# Patient Record
Sex: Male | Born: 1960 | Race: Black or African American | Hispanic: No | Marital: Married | Smoking: Current some day smoker
Health system: Southern US, Community
[De-identification: ages and names within clinical notes are randomized; demographics above are authoritative.]

## PROBLEM LIST (undated history)

## (undated) DIAGNOSIS — K219 Gastro-esophageal reflux disease without esophagitis: Secondary | ICD-10-CM

## (undated) DIAGNOSIS — I10 Essential (primary) hypertension: Secondary | ICD-10-CM

## (undated) DIAGNOSIS — M109 Gout, unspecified: Secondary | ICD-10-CM

## (undated) HISTORY — DX: Gout, unspecified: M10.9

## (undated) HISTORY — PX: ABDOMINAL SURGERY: SHX537

## (undated) HISTORY — DX: Essential (primary) hypertension: I10

## (undated) HISTORY — PX: OTHER SURGICAL HISTORY: SHX169

---

## 2005-03-18 ENCOUNTER — Emergency Department: Payer: Self-pay | Admitting: Internal Medicine

## 2013-03-30 ENCOUNTER — Emergency Department: Payer: Self-pay | Admitting: Emergency Medicine

## 2013-03-30 LAB — BASIC METABOLIC PANEL
Anion Gap: 8 (ref 7–16)
BUN: 13 mg/dL (ref 7–18)
CHLORIDE: 104 mmol/L (ref 98–107)
Calcium, Total: 9.4 mg/dL (ref 8.5–10.1)
Co2: 25 mmol/L (ref 21–32)
Creatinine: 1.08 mg/dL (ref 0.60–1.30)
EGFR (Non-African Amer.): >60
GLUCOSE: 148 mg/dL — AB (ref 65–99)
Osmolality: 277 (ref 275–301)
POTASSIUM: 3.5 mmol/L (ref 3.5–5.1)
Sodium: 137 mmol/L (ref 136–145)

## 2013-03-30 LAB — CBC
HCT: 40.6 % (ref 40.0–52.0)
HGB: 13.8 g/dL (ref 13.0–18.0)
MCH: 31.9 pg (ref 26.0–34.0)
MCHC: 34.1 g/dL (ref 32.0–36.0)
MCV: 93 fL (ref 80–100)
PLATELETS: 333 10*3/uL (ref 150–440)
RBC: 4.34 10*6/uL — AB (ref 4.40–5.90)
RDW: 14.3 % (ref 11.5–14.5)
WBC: 6.1 10*3/uL (ref 3.8–10.6)

## 2013-03-30 LAB — TROPONIN I: Troponin-I: <0.02 ng/mL

## 2013-05-20 ENCOUNTER — Ambulatory Visit (INDEPENDENT_AMBULATORY_CARE_PROVIDER_SITE_OTHER): Payer: 59 | Admitting: Cardiovascular Disease

## 2013-05-20 ENCOUNTER — Encounter: Payer: Self-pay | Admitting: Cardiovascular Disease

## 2013-05-20 ENCOUNTER — Encounter (INDEPENDENT_AMBULATORY_CARE_PROVIDER_SITE_OTHER): Payer: Self-pay

## 2013-05-20 VITALS — BP 120/94 | HR 70 | Ht 73.5 in | Wt 294.5 lb

## 2013-05-20 DIAGNOSIS — R0789 Other chest pain: Secondary | ICD-10-CM | POA: Insufficient documentation

## 2013-05-20 DIAGNOSIS — I10 Essential (primary) hypertension: Secondary | ICD-10-CM | POA: Insufficient documentation

## 2013-05-20 MED ORDER — HYDROCHLOROTHIAZIDE 25 MG PO TABS
25.0000 mg | ORAL_TABLET | Freq: Every day | ORAL | Status: DC
Start: 2013-05-20 — End: 2013-12-27

## 2013-05-20 MED ORDER — POTASSIUM CHLORIDE CRYS ER 10 MEQ PO TBCR: 10.0000 meq | EXTENDED_RELEASE_TABLET | Freq: Once | ORAL | Status: DC

## 2013-05-20 MED ORDER — LISINOPRIL 20 MG PO TABS: 20.0000 mg | ORAL_TABLET | Freq: Every day | ORAL | Status: DC
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
## 2013-05-20 NOTE — Patient Instructions (Addendum)
Please take HCTZ 25 mg daily Please take K-dur 10 mg daily  Your physician has requested that you have an exercise tolerance test. For further information please visit [email or web address]. Please also follow instruction sheet, as given.  Your physician recommends that you schedule a follow-up appointment in: 3 months We will draw fasting labs at that time:  Lipid panel, liver panel, BMET

## 2013-05-20 NOTE — Assessment & Plan Note (Signed)
He said 2 episodes of mild chest discomfort. These persisted with increased blood pressure. It's not clear that these were due to angina. It may be more related to high blood pressure.  We'll bring him back for a treadmill test in the next week or so. He has an  intermediate risk of having coronary artery disease with his history of smoking and hypertension.

## 2013-05-20 NOTE — Assessment & Plan Note (Signed)
His diastolic blood pressures little elevated. We'll continue with his current dose of lisinopril. We'll add hydrochlorothiazide 25 mg a day and potassium chloride 10 mg a day. I'll see him again in 3 months for followup visit, fasting lipids, liver enzymes, and basic metabolic profile.

## 2013-06-10 ENCOUNTER — Encounter: Payer: 59 | Admitting: Cardiovascular Disease

## 2013-06-24 ENCOUNTER — Telehealth: Payer: Self-pay | Admitting: *Deleted

## 2013-06-24 NOTE — Telephone Encounter (Signed)
Informed patient that we will have to cancel his GXT for tomorrow.  I told him we will call him with a new GXT date as soon as we have one available. Patient verbalized understanding.   

## 2013-06-25 ENCOUNTER — Encounter: Payer: 59 | Admitting: Cardiovascular Disease

## 2013-07-23 ENCOUNTER — Telehealth: Payer: Self-pay | Admitting: *Deleted

## 2013-07-23 NOTE — Telephone Encounter (Signed)
Reviewed GXT instructions with patient  Patient verbalized understanding

## 2013-08-06 ENCOUNTER — Encounter: Payer: 59 | Admitting: Cardiovascular Disease

## 2013-08-09 ENCOUNTER — Other Ambulatory Visit: Payer: 59

## 2013-08-09 ENCOUNTER — Ambulatory Visit: Payer: 59 | Admitting: Cardiovascular Disease

## 2013-08-09 ENCOUNTER — Encounter: Payer: Self-pay | Admitting: Cardiovascular Disease

## 2013-08-09 ENCOUNTER — Ambulatory Visit (INDEPENDENT_AMBULATORY_CARE_PROVIDER_SITE_OTHER): Payer: 59 | Admitting: Cardiovascular Disease

## 2013-08-09 VITALS — BP 123/93 | HR 75 | Ht 73.0 in | Wt 288.0 lb

## 2013-08-09 DIAGNOSIS — I10 Essential (primary) hypertension: Secondary | ICD-10-CM

## 2013-08-09 DIAGNOSIS — R0789 Other chest pain: Secondary | ICD-10-CM

## 2013-08-09 MED ORDER — CYCLOBENZAPRINE HCL 10 MG PO TABS: 10.0000 mg | ORAL_TABLET | Freq: Three times a day (TID) | ORAL | Status: AC | PRN

## 2013-08-09 NOTE — Procedures (Deleted)
Exercise Treadmill Test  Pre-Exercise Testing Evaluation Rhythm: {CHL RHYTHM BASELINE EKG FOR ETT:21021046}  Rate: {CHL RATE BASELINE EKG FOR ETT:21021048}   PR:  {CHL PR BASELINE EKG FOR ETT:21021049} QRS:  {CHL QRS BASELINE EKG FOR ETT:21021050}  QT:  {CHL QT BASELINE EKG FOR ETT:21021051} QTc: {CHL QTC BASELINE EKG FOR ETT:21021052}   P axis: {CHL AXIS BASELINE EKG FOR ETT:21021053}  QRS axis:  {CHL AXIS BASELINE EKG FOR ETT:21021053}  ST Segments:  {CHL ST SEGMENTS BASELINE EKG FOR ETT:21021054}     Test  Exercise Tolerance Test Ordering MD: {CHL LB CARDIOLOGY MD FOR ETT:21021055}  Interpreting MD: {CHL LB CARDIOLOGY INTERPRET MD FOR ETT:21020517}  Unique Test No: ***  Treadmill:  {CHL TREADMILL # FOR ETT:21021057}  Indication for ETT: {CHL INDICATION FOR ETT:21021058}  Contraindication to ETT: {CHL CONTRAINDICATION TO ETT:21021059}   Stress Modality: {CHL STRESS MODALITY FOR ETT:21021060}  Cardiac Imaging Performed: {CHL CARDIAC IMAGING PERFORMED FOR ETT:21021063}   Protocol: {CHL PROTOCOL FOR ETT:21021061}  Max BP:  ***/***  Max MPHR (bpm):  *** 85% MPR (bpm):  ***  MPHR obtained (bpm):  *** % MPHR obtained:  ***  Reached 85% MPHR (min:sec):  *** Total Exercise Time (min-sec):  ***  Workload in METS:  *** Borg Scale: ***  Reason ETT Terminated:  {CHL REASON TERMINATED FOR ETT:21021064}    ST Segment Analysis At Rest: {CHL ST SEGMENT AT REST FOR ETT:21021065} With Exercise: {CHL ST SEGMENT WITH EXERCISE FOR ETT:21021066}  Other Information Arrhythmia:  {CHL ARRHYTHMIA FOR ETT:21021070} Angina during ETT:  {CHL ANGINA DURING ETT:21021071} Quality of ETT:  {CHL QUALITY OF ETT:21021072}  ETT Interpretation:  {CHL INTERPRETATION FOR ETT:21021073}  Comments: ***  Recommendations: ***   

## 2013-08-09 NOTE — Progress Notes (Signed)
Adult Stress Test Report  08/09/2013   Requesting Physician: No PCP Per Patient  Jim Juarez was seen several months ago with CP.  He was evaluated in the ER. Marland Kitchen.  He was scheduled for stress test.  He has not had any CP since that time 53 yo.  Will try to achieve a peak HR of 151 (90%)    Walked for 9 minutes, completion of stage III.  Peak HR of 166  Study: exercise ECG  Pre-test ECG: normal  Level of Stress:  99 % age-predicted max HR   METS achieved  Functional Capacity: better than average  Abnormal Symptoms: none  Heart Rate Response: normal  BP Response:  normal  Baseline LVEF:   Stress ECG: no ECG changes  Stress Imaging Report:     Impression:   Normal test.  He walked for a total of 9 minutes on a standard Bruce protocol treadmill test. This is completion of stage III. He achieved a peak heart rate of 166 which is 99% of his predicted maximal heart rate. He has no ST or T wave changes. His blood pressure response to exercise was normal. He had no chest pain.  I suspect that his chest and that he had several months ago was noncardiac. He'll followup as needed.   Interpreted by:  Elyn AquasPhilip Serria Sloma Jr. 08/09/2013

## 2013-08-09 NOTE — Patient Instructions (Addendum)
Normal stress test. BMET today.  Rx refill sent for Flexeril.

## 2013-08-10 LAB — BASIC METABOLIC PANEL
BUN/Creatinine Ratio: 12 (ref 9–20)
BUN: 17 mg/dL (ref 6–24)
CALCIUM: 10.5 mg/dL — AB (ref 8.7–10.2)
CHLORIDE: 95 mmol/L — AB (ref 97–108)
CO2: 22 mmol/L (ref 18–29)
Creatinine, Ser: 1.46 mg/dL — ABNORMAL HIGH (ref 0.76–1.27)
GFR calc Af Amer: 63 mL/min/{1.73_m2} (ref >59)
GFR, EST NON AFRICAN AMERICAN: 55 mL/min/{1.73_m2} — AB (ref >59)
Glucose: 89 mg/dL (ref 65–99)
POTASSIUM: 4.7 mmol/L (ref 3.5–5.2)
Sodium: 138 mmol/L (ref 134–144)

## 2013-09-13 ENCOUNTER — Ambulatory Visit: Payer: Self-pay | Admitting: Family Medicine

## 2013-12-26 ENCOUNTER — Ambulatory Visit: Payer: Self-pay | Admitting: Physician Assistant

## 2013-12-27 ENCOUNTER — Other Ambulatory Visit: Payer: Self-pay | Admitting: Cardiovascular Disease

## 2014-08-21 ENCOUNTER — Ambulatory Visit
Admission: EM | Admit: 2014-08-21 | Discharge: 2014-08-21 | Disposition: A | Discharge status: Home / Self Care | Payer: 59 | Attending: Internal Medicine | Admitting: Internal Medicine

## 2014-08-21 DIAGNOSIS — M25562 Pain in left knee: Secondary | ICD-10-CM | POA: Diagnosis not present

## 2014-08-21 DIAGNOSIS — M25462 Effusion, left knee: Secondary | ICD-10-CM

## 2014-08-21 HISTORY — DX: Gastro-esophageal reflux disease without esophagitis: K21.9

## 2014-08-21 MED ORDER — NAPROXEN 500 MG PO TABS: 500.0000 mg | ORAL_TABLET | Freq: Two times a day (BID) | ORAL | Status: DC

## 2014-08-21 MED ORDER — KETOROLAC TROMETHAMINE 60 MG/2ML IM SOLN
60.0000 mg | Freq: Once | INTRAMUSCULAR | Status: AC
  Administered 2014-08-21: 60 mg via INTRAMUSCULAR

## 2014-08-21 NOTE — Discharge Instructions (Signed)

## 2014-08-21 NOTE — ED Notes (Signed)
Tore left meniscus approx 6 months ago. Saw Ortho and had steroid injection. For the last 3 days left knee "stiff and swollen"
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
## 2014-12-30 ENCOUNTER — Other Ambulatory Visit: Payer: Self-pay | Admitting: Cardiovascular Disease

## 2015-01-04 ENCOUNTER — Other Ambulatory Visit: Payer: Self-pay | Admitting: Cardiovascular Disease

## 2015-02-04 ENCOUNTER — Encounter: Payer: Self-pay | Admitting: *Deleted

## 2015-02-04 ENCOUNTER — Other Ambulatory Visit: Payer: Self-pay | Admitting: Cardiovascular Disease

## 2015-02-04 DIAGNOSIS — K219 Gastro-esophageal reflux disease without esophagitis: Secondary | ICD-10-CM | POA: Insufficient documentation

## 2015-02-04 DIAGNOSIS — I10 Essential (primary) hypertension: Secondary | ICD-10-CM | POA: Insufficient documentation

## 2015-04-03 ENCOUNTER — Ambulatory Visit
Admission: EM | Admit: 2015-04-03 | Discharge: 2015-04-03 | Disposition: A | Discharge status: Home / Self Care | Payer: 59 | Attending: Family Medicine | Admitting: Family Medicine

## 2015-04-03 ENCOUNTER — Ambulatory Visit (INDEPENDENT_AMBULATORY_CARE_PROVIDER_SITE_OTHER): Payer: 59

## 2015-04-03 ENCOUNTER — Encounter: Payer: Self-pay | Admitting: Emergency Medicine

## 2015-04-03 DIAGNOSIS — S42401A Unspecified fracture of lower end of right humerus, initial encounter for closed fracture: Secondary | ICD-10-CM | POA: Diagnosis not present

## 2015-04-03 MED ORDER — MELOXICAM 15 MG PO TABS: 15.0000 mg | ORAL_TABLET | Freq: Every day | ORAL | Status: DC | PRN

## 2015-04-03 MED ORDER — TRAMADOL HCL 50 MG PO TABS: 50.0000 mg | ORAL_TABLET | Freq: Three times a day (TID) | ORAL | Status: DC | PRN

## 2015-04-03 NOTE — Discharge Instructions (Signed)
Take medication as prescribed. Apply ice and elevate.   Follow up next week with orthopedic as discussed, see above to call to schedule.   Return to Urgent care as needed for new or worsening concerns.   Elbow Fracture, Simple A fracture is a break in one of the bones.When fractures are not displaced or separated, they may be treated with only a sling or splint. The sling or splint may only be required for two to three weeks. In these cases, often the elbow is put through early range of motion exercises to prevent the elbow from getting stiff. DIAGNOSIS  The diagnosis (learning what is wrong) of a fractured elbow is made by x-ray. These may be required before and after the elbow is put into a splint or cast. X-rays are taken after to make sure the bone pieces have not moved. HOME CARE INSTRUCTIONS   Only take over-the-counter or prescription medicines for pain, discomfort, or fever as directed by your caregiver.  If you have a splint held on with an elastic wrap and your hand or fingers become numb or cold and blue, loosen the wrap and reapply more loosely. See your caregiver if there is no relief.  You may use ice for twenty minutes, four times per day, for the first two to three days.  Use your elbow as directed.  See your caregiver as directed. It is very important to keep all follow-up referrals and appointments in order to avoid any long-term problems with your elbow including chronic pain or stiffness. SEEK IMMEDIATE MEDICAL CARE IF:   There is swelling or increasing pain in elbow.  You begin to lose feeling or experience numbness or tingling in your hand or fingers.  You develop swelling of the hand and fingers.  You get a cold or blue hand or fingers on affected side. MAKE SURE YOU:   Understand these instructions.  Will watch your condition.  Will get help right away if you are not doing well or get worse.   This information is not intended to replace advice given to  you by your health care provider. Make sure you discuss any questions you have with your health care provider.   Document Released: 02/08/2001 Document Revised: 05/09/2011 Document Reviewed: 12/30/2008 Elsevier Interactive Patient Education [employer]
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
## 2015-04-03 NOTE — ED Notes (Signed)
Patient states that he fell and landed on his right elbow yesterday. Patient c/o pain in his right elbow.

## 2015-07-15 DIAGNOSIS — M545 Low back pain, unspecified: Secondary | ICD-10-CM | POA: Insufficient documentation

## 2015-09-10 ENCOUNTER — Encounter: Payer: Self-pay | Admitting: *Deleted

## 2015-09-10 ENCOUNTER — Ambulatory Visit
Admission: EM | Admit: 2015-09-10 | Discharge: 2015-09-10 | Disposition: A | Discharge status: Home / Self Care | Payer: 59 | Attending: Emergency Medicine | Admitting: Emergency Medicine

## 2015-09-10 DIAGNOSIS — T672XXA Heat cramp, initial encounter: Secondary | ICD-10-CM | POA: Diagnosis not present

## 2015-09-10 DIAGNOSIS — E860 Dehydration: Secondary | ICD-10-CM

## 2015-09-10 LAB — CBC WITH DIFFERENTIAL/PLATELET
BASOS PCT: 1 %
Basophils Absolute: 0.1 10*3/uL (ref 0–0.1)
Eosinophils Absolute: 0.1 10*3/uL (ref 0–0.7)
Eosinophils Relative: 1 %
HEMATOCRIT: 43.5 % (ref 40.0–52.0)
HEMOGLOBIN: 15.1 g/dL (ref 13.0–18.0)
LYMPHS ABS: 2.9 10*3/uL (ref 1.0–3.6)
Lymphocytes Relative: 26 %
MCH: 31.7 pg (ref 26.0–34.0)
MCHC: 34.6 g/dL (ref 32.0–36.0)
MCV: 91.5 fL (ref 80.0–100.0)
MONOS PCT: 12 %
Monocytes Absolute: 1.3 10*3/uL — ABNORMAL HIGH (ref 0.2–1.0)
NEUTROS ABS: 6.6 10*3/uL — AB (ref 1.4–6.5)
NEUTROS PCT: 60 %
Platelets: 314 10*3/uL (ref 150–440)
RBC: 4.76 MIL/uL (ref 4.40–5.90)
RDW: 13.8 % (ref 11.5–14.5)
WBC: 11 10*3/uL — ABNORMAL HIGH (ref 3.8–10.6)

## 2015-09-10 LAB — COMPREHENSIVE METABOLIC PANEL
ALT: 40 U/L (ref 17–63)
ANION GAP: 8 (ref 5–15)
AST: 31 U/L (ref 15–41)
Albumin: 4.5 g/dL (ref 3.5–5.0)
Alkaline Phosphatase: 87 U/L (ref 38–126)
BUN: 20 mg/dL (ref 6–20)
CHLORIDE: 100 mmol/L — AB (ref 101–111)
CO2: 25 mmol/L (ref 22–32)
Calcium: 9.3 mg/dL (ref 8.9–10.3)
Creatinine, Ser: 1.2 mg/dL (ref 0.61–1.24)
GFR calc non Af Amer: >60 mL/min (ref >60)
Glucose, Bld: 97 mg/dL (ref 65–99)
Potassium: 4.1 mmol/L (ref 3.5–5.1)
SODIUM: 133 mmol/L — AB (ref 135–145)
Total Bilirubin: 1.4 mg/dL — ABNORMAL HIGH (ref 0.3–1.2)
Total Protein: 8.6 g/dL — ABNORMAL HIGH (ref 6.5–8.1)

## 2015-09-10 MED ORDER — SODIUM CHLORIDE 0.9 % IV BOLUS (SEPSIS)
1000.0000 mL | Freq: Once | INTRAVENOUS | Status: AC
  Administered 2015-09-10: 1000 mL via INTRAVENOUS

## 2015-09-10 NOTE — ED Notes (Signed)
Pt working in hot environment past few days (hotter than usual). Onset of generalized muscle cramps approx 1600 today. Pt seen by company nurse and referred here for dehydration.
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
## 2015-09-10 NOTE — Discharge Instructions (Signed)
Dehydration, Adult Dehydration is a condition in which you do not have enough fluid or water in your body. It happens when you take in less fluid than you lose. Vital organs such as the kidneys, brain, and heart cannot function without a proper amount of fluids. Any loss of fluids from the body can cause dehydration.  Dehydration can range from mild to severe. This condition should be treated right away to help prevent it from becoming severe. CAUSES  This condition may be caused by:  Vomiting.  Diarrhea.  Excessive sweating, such as when exercising in hot or humid weather.  Not drinking enough fluid during strenuous exercise or during an illness.  Excessive urine output.  Fever.  Certain medicines. RISK FACTORS This condition is more likely to develop in:  People who are taking certain medicines that cause the body to lose excess fluid (diuretics).   People who have a chronic illness, such as diabetes, that may increase urination.  Older adults.   People who live at high altitudes.   People who participate in endurance sports.  SYMPTOMS  Mild Dehydration  Thirst.  Dry lips.  Slightly dry mouth.  Dry, warm skin. Moderate Dehydration  Very dry mouth.   Muscle cramps.   Dark urine and decreased urine production.   Decreased tear production.   Headache.   Light-headedness, especially when you stand up from a sitting position.  Severe Dehydration  Changes in skin.   Cold and clammy skin.   Skin does not spring back quickly when lightly pinched and released.   Changes in body fluids.   Extreme thirst.   No tears.   Not able to sweat when body temperature is high, such as in hot weather.   Minimal urine production.   Changes in vital signs.   Rapid, weak pulse (more than 100 beats per minute when you are sitting still).   Rapid breathing.   Low blood pressure.   Other changes.   Sunken eyes.   Cold hands and feet.    Confusion.  Lethargy and difficulty being awakened.  Fainting (syncope).   Short-term weight loss.   Unconsciousness. DIAGNOSIS  This condition may be diagnosed based on your symptoms. You may also have tests to determine how severe your dehydration is. These tests may include:   Urine tests.   Blood tests.  TREATMENT  Treatment for this condition depends on the severity. Mild or moderate dehydration can often be treated at home. Treatment should be started right away. Do not wait until dehydration becomes severe. Severe dehydration needs to be treated at the hospital. Treatment for Mild Dehydration  Drinking plenty of water to replace the fluid you have lost.   Replacing minerals in your blood (electrolytes) that you may have lost.  Treatment for Moderate Dehydration  Consuming oral rehydration solution (ORS). Treatment for Severe Dehydration  Receiving fluid through an IV tube.   Receiving electrolyte solution through a feeding tube that is passed through your nose and into your stomach (nasogastric tube or NG tube).  Correcting any abnormalities in electrolytes. HOME CARE INSTRUCTIONS   Drink enough fluid to keep your urine clear or pale yellow.   Drink water or fluid slowly by taking small sips. You can also try sucking on ice cubes.  Have food or beverages that contain electrolytes. Examples include bananas and sports drinks.  Take over-the-counter and prescription medicines only as told by your health care provider.   Prepare ORS according to the manufacturer's instructions. Take sips  of ORS every 5 minutes until your urine returns to normal. °· If you have vomiting or diarrhea, continue to try to drink water, ORS, or both.   °· If you have diarrhea, avoid:   °¨ Beverages that contain caffeine.   °¨ Fruit juice.   °¨ Milk.    °¨ Carbonated soft drinks. °· Do not take salt tablets. This can lead to the condition of having too much sodium in your body  (hypernatremia).   °SEEK MEDICAL CARE IF: °· You cannot eat or drink without vomiting. °· You have had moderate diarrhea during a period of more than 24 hours. °· You have a fever. °SEEK IMMEDIATE MEDICAL CARE IF:  °· You have extreme thirst. °· You have severe diarrhea. °· You have not urinated in 6-8 hours, or you have urinated only a small amount of very dark urine. °· You have shriveled skin. °· You are dizzy, confused, or both. °  °This information is not intended to replace advice given to you by your health care provider. Make sure you discuss any questions you have with your health care provider. °  °Document Released: 02/14/2005 Document Revised: 11/05/2014 Document Reviewed: 07/02/2014 °Elsevier Interactive Patient Education ©2016 Else[employer] Inc. ° °Rehydration, Adult °Rehydration is the replacement of body fluids lost during dehydration. Dehydration is an extreme loss of body fluids to the point of body function impairment. There are many ways extreme fluid loss can occur, including vomiting, diarrhea, or excess sweating. Recovering from dehydration requires replacing lost fluids, continuing to eat to maintain strength, and avoiding foods and beverages that may contribute to further fluid loss or may increase nausea. °HOW TO REHYDRATE °In most cases, rehydration involves the replacement of not only fluids but also carbohydrates and basic body salts. Rehydration with an oral rehydration solution is one way to replace essential nutrients lost through dehydration. °An oral rehydration solution can be purchased at pharmacies, retail stores, and online. Premixed packets of powder that you combine with water to make a solution are also sold. You can prepare an oral rehydration solution at home by mixing the following ingredients together:  °·  - tsp table salt. °· ¾ tsp baking soda. °·  tsp salt substitute containing potassium chloride. °· 1 tablespoons sugar. °· 1 L (34 oz) of water. °Be sure to use exact  measurements. Including too much sugar can make diarrhea worse. °Drink ½-1 cup (120-240 mL) of oral rehydration solution each time you have diarrhea or vomit. If drinking this amount makes your vomiting worse, try drinking smaller amounts more often. For example, drink 1-3 tsp every 5-10 minutes.  °A general rule for staying hydrated is to drink 1½-2 L of fluid per day. Talk to your caregiver about the specific amount you should be drinking each day. Drink enough fluids to keep your urine clear or pale yellow. °EATING WHEN DEHYDRATED °Even if you have had severe sweating or you are having diarrhea, do not stop eating. Many healthy items in a normal diet are okay to continue eating while recovering from dehydration. The following tips can help you to lessen nausea when you eat: °· Ask someone else to prepare your food. Cooking smells may worsen nausea. °· Eat in a well-ventilated room away from cooking smells. °· Sit up when you eat. Avoid lying down until 1-2 hours after eating. °· Eat small amounts when you eat. °· Eat foods that are easy to digest. These include soft, well-cooked, or mashed foods. °FOODS AND BEVERAGES TO AVOID °Avoid eating or drinking the following foods and   beverages that may increase nausea or further loss of fluid:   Fruit juices with a high sugar content, such as concentrated juices.  Alcohol.  Beverages containing caffeine.  Carbonated drinks. They may cause a lot of gas.  Foods that may cause a lot of gas, such as cabbage, broccoli, and beans.  Fatty, greasy, and fried foods.  Spicy, very salty, and very sweet foods or drinks.  Foods or drinks that are very hot or very cold. Consume food or drinks at or near room temperature.  Foods that need a lot of chewing, such as raw vegetables.  Foods that are sticky or hard to swallow, such as peanut butter.   This information is not intended to replace advice given to you by your health care provider. Make sure you discuss any  questions you have with your health care provider.   Document Released: 05/09/2011 Document Revised: 11/09/2011 Document Reviewed: 05/09/2011 Elsevier Interactive Patient Education 2016 [employer].  [employer] Exhaustion Information WHAT IS HEAT EXHAUSTION? Heat exhaustion happens when your body gets overheated from hot weather or from exercise. Heat exhaustion makes the temperature of your skin and body go up. Your body cools itself by sweating. If you do not drink enough water to replace what you sweat, you lose too much water and salt. This makes it harder for your body to produce more sweat. When you do not sweat enough, your body cannot cool down, and heat exhaustion may result. Heat exhaustion can lead to heatstroke, which is a more serious illness. WHO IS AT RISK FOR HEAT EXHAUSTION? Anyone can get heat exhaustion. However, heat exhaustion is more likely when you are exercising or doing a physical activity. It is also more likely when you are in hot and humid weather or bright sunshine. Heat exhaustion is also more likely to develop in:  People who are age 64 or older.  Children.  People who have a medical condition such as heart disease, poor circulation, sickle cell disease, or high blood pressure.  People who have a fever.  People who are very overweight (obese).  People who are dehydrated from:  Drinking alcohol or caffeine.  Taking certain medicines, such as diuretics or stimulants. WHAT ARE THE SYMPTOMS OF HEAT EXHAUSTION? Symptoms of heat exhaustion include:  A body temperature of up to 104F (40C).  Moist, cool, and clammy skin.  Dizziness.  Headache.  Nausea.  Fatigue.  Thirst.  Dark-colored urine.  Rapid pulse or heartbeat.  Weakness.  Muscle cramps.  Confusion.  Fainting. WHAT SHOULD I DO IF I THINK I HAVE HEAT EXHAUSTION? If you think that you have heat exhaustion, call your health care provider. Follow his or her instructions. You should  also:  Call a friend or a family member and ask someone to stay with you.  Move to a cooler location, such as:  Into the shade.  In front of a fan.  Someplace that has air conditioning.  Lie down and rest.  Slowly drink nonalcoholic, caffeine-free fluids.  Take off any extra clothing or tight-fitting clothes.  Take a cool bath or shower, if possible. If you do not have access to a bath or shower, dab or mist cool water on your skin. WHY IS IT IMPORTANT TO TREAT HEAT EXHAUSTION? It is extremely important to take care of yourself and treat heat exhaustion as soon as possible. Untreated heat exhaustion can turn into heatstroke. Symptoms of heatstroke include:  A body temperature of 104F (40C) or higher.  Hot, red  skin that may be dry or moist.  Severe headache.  Nausea and vomiting.  Muscle weakness and cramping.  Confusion.  Rapid breathing.  Fainting.  Seizure. These symptoms may represent a serious problem that is an emergency. Do not wait to see if the symptoms will go away. Get medical help right away. Call your local emergency services (911 in the [place] Do not drive yourself to the hospital. Heatstroke is a life-threatening condition that requires urgent medical treatment. Do not treat heatstroke at home. Heatstroke should be treated by a health care professional and may require hospitalization. At the hospital, you may need to receive fluids through an IV tube:  If you cannot drink any fluids.  If you vomit any fluids that you drink.  If your symptoms do not get better after one hour.  If your symptoms get worse after one hour. HOW CAN I PREVENT HEAT EXHAUSTION?  Avoid outdoor activities on very hot or humid days.  Do not exercise or do other physical activity when you are not feeling well.  Drink plenty of nonalcoholic and caffeine-free fluids before and during physical activity.  Take frequent breaks for rest during physical activity.  Wear  light-colored, loose-fitting, and lightweight clothing in the heat.  Wear a hat and use sunscreen when exercising outdoors.  Avoid being outside during the hottest times of the day.  Check with your health care provider before you start any new activity, especially if you take medicine or have a medical condition.  Start any new activity slowly and work up to your fitness level. HOW CAN I HELP TO PROTECT ELDERLY RELATIVES AND NEIGHBORS FROM HEAT EXHAUSTION? People who are age 54 or older are at greater risk for heat exhaustion. Their bodies have a harder time adjusting to heat. They are also more likely to have a medical condition or be on medicines that increase their risk for heat exhaustion. They may get heat exhaustion indoors if the heat is high for several days. You can help to protect them during hot weather by:  Checking on them two or more times each day.  Making sure that they are drinking plenty of cool, nonalcoholic, and caffeine-free fluids.  Making sure that they use their air conditioner.  Taking them to a location where air conditioning is available.  Talking with their health care provider about their medical needs, medicines, and fluid requirements.   This information is not intended to replace advice given to you by your health care provider. Make sure you discuss any questions you have with your health care provider.   Document Released: 11/24/2007 Document Revised: 11/05/2014 Document Reviewed: 01/22/2014 Elsevier Interactive Patient Education [employer]2016 Elsevier Inc.

## 2015-10-13 ENCOUNTER — Ambulatory Visit
Admission: EM | Admit: 2015-10-13 | Discharge: 2015-10-13 | Disposition: A | Discharge status: Home / Self Care | Payer: 59 | Attending: Family Medicine | Admitting: Family Medicine

## 2015-10-13 DIAGNOSIS — S0502XA Injury of conjunctiva and corneal abrasion without foreign body, left eye, initial encounter: Secondary | ICD-10-CM

## 2015-10-13 MED ORDER — MOXIFLOXACIN HCL 0.5 % OP SOLN: 1.0000 [drp] | Freq: Three times a day (TID) | OPHTHALMIC | 0 refills | Status: DC
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
## 2015-10-13 NOTE — ED Triage Notes (Signed)
Patient was cutting trees in the dog lot on Saturday and he feels like something got into his eye. The swelling has gone down but it still feel gritty.

## 2016-06-10 DIAGNOSIS — Z8739 Personal history of other diseases of the musculoskeletal system and connective tissue: Secondary | ICD-10-CM | POA: Insufficient documentation

## 2016-07-01 ENCOUNTER — Ambulatory Visit
Admission: EM | Admit: 2016-07-01 | Discharge: 2016-07-01 | Disposition: A | Discharge status: Home / Self Care | Payer: 59 | Attending: Emergency Medicine | Admitting: Emergency Medicine

## 2016-07-01 ENCOUNTER — Encounter: Payer: Self-pay | Admitting: Gynecology

## 2016-07-01 DIAGNOSIS — M10032 Idiopathic gout, left wrist: Secondary | ICD-10-CM

## 2016-07-01 MED ORDER — TRAMADOL HCL 50 MG PO TABS: 50.0000 mg | ORAL_TABLET | Freq: Four times a day (QID) | ORAL | 0 refills | Status: DC | PRN

## 2016-07-01 MED ORDER — IBUPROFEN 600 MG PO TABS: 600.0000 mg | ORAL_TABLET | Freq: Four times a day (QID) | ORAL | 0 refills | Status: AC | PRN

## 2016-07-01 MED ORDER — METHYLPREDNISOLONE SODIUM SUCC 125 MG IJ SOLR
125.0000 mg | Freq: Once | INTRAMUSCULAR | Status: AC
  Administered 2016-07-01: 125 mg via INTRAMUSCULAR

## 2016-07-01 MED ORDER — COLCHICINE 0.6 MG PO TABS: ORAL_TABLET | ORAL | 1 refills | Status: DC

## 2016-07-01 NOTE — Discharge Instructions (Signed)
Go to www.goodrx.com to look up your medications. This will give you a list of where you can find your prescriptions at the most affordable prices.   Dietary treatment plays a supplementary role in treatment of gout.  Diet can be expected to decrease the uric acid level by 10 to 15%.  Often medication can effect a much more substantial reduction.  An extremely restrictive diet is not necessary, but here are a few suggestions that might help decrease the frequency of gout attacks.  The first and most important measure is to achieve and maintain a healthy body weight (BMI of 20 to 25).  It's been shown that people with a BMI over 25 have an increased risk of gout attacks compared to people with a BMI of less than 25.  Also, gout patients who lose as little as 4.5 kg or 9.9 lbs will decrease their risk of gout attacks.  Beyond that, here are a few general guidelines:  Eat less: Red meat Seafood Beer and hard alcohol (e.g. gin, vodka, whiskey) Foods that contain high fructose corn syrup (found in sweets and non-diet sodas) Organ meats (liver, kidneys, brains, sweetbreads) or foods made from these meats (hot dogs, bologna).  Eat more: Low fat dairy products A moderate amount of wine (up to two 5 oz servings per day) is not likely to increase the risk of a gout attack. Coffee may decrease the risk of gout attacks Vitamin C (500 mg per day) has a mild urate lowering effect

## 2016-07-01 NOTE — ED Triage Notes (Signed)
Patient stated seen by his primary care x 2 weeks for his left wrist pain and diagnose with gout. Per patient was given medication for his gout , however out of his medication and now pain is back.
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
## 2016-07-26 DIAGNOSIS — J0101 Acute recurrent maxillary sinusitis: Secondary | ICD-10-CM | POA: Insufficient documentation

## 2016-07-26 DIAGNOSIS — I10 Essential (primary) hypertension: Secondary | ICD-10-CM | POA: Diagnosis not present

## 2016-07-26 DIAGNOSIS — Z791 Long term (current) use of non-steroidal anti-inflammatories (NSAID): Secondary | ICD-10-CM | POA: Insufficient documentation

## 2016-07-26 DIAGNOSIS — Z79899 Other long term (current) drug therapy: Secondary | ICD-10-CM | POA: Insufficient documentation

## 2016-07-26 DIAGNOSIS — Z7982 Long term (current) use of aspirin: Secondary | ICD-10-CM | POA: Insufficient documentation

## 2016-07-26 DIAGNOSIS — F17210 Nicotine dependence, cigarettes, uncomplicated: Secondary | ICD-10-CM | POA: Diagnosis not present

## 2016-07-26 DIAGNOSIS — R42 Dizziness and giddiness: Secondary | ICD-10-CM | POA: Diagnosis present

## 2016-07-26 LAB — BASIC METABOLIC PANEL
ANION GAP: 7 (ref 5–15)
BUN: 13 mg/dL (ref 6–20)
CHLORIDE: 104 mmol/L (ref 101–111)
CO2: 30 mmol/L (ref 22–32)
Calcium: 9.4 mg/dL (ref 8.9–10.3)
Creatinine, Ser: 1.15 mg/dL (ref 0.61–1.24)
GFR calc Af Amer: >60 mL/min (ref >60)
GLUCOSE: 101 mg/dL — AB (ref 65–99)
POTASSIUM: 4.1 mmol/L (ref 3.5–5.1)
Sodium: 141 mmol/L (ref 135–145)

## 2016-07-26 LAB — CBC
HEMATOCRIT: 39.2 % — AB (ref 40.0–52.0)
HEMOGLOBIN: 13.5 g/dL (ref 13.0–18.0)
MCH: 31.1 pg (ref 26.0–34.0)
MCHC: 34.4 g/dL (ref 32.0–36.0)
MCV: 90.5 fL (ref 80.0–100.0)
Platelets: 391 10*3/uL (ref 150–440)
RBC: 4.33 MIL/uL — AB (ref 4.40–5.90)
RDW: 13.8 % (ref 11.5–14.5)
WBC: 8.7 10*3/uL (ref 3.8–10.6)

## 2016-07-26 LAB — TROPONIN I: Troponin I: <0.03 ng/mL (ref <0.03)

## 2016-07-26 NOTE — ED Triage Notes (Signed)
Pt presents to ED via POV with c/o HYPERtension. Pt states he was at work, felt a little lightheaded and the "1st responders" at his work checked his BP and it was 150/115. Pt reports compliance with prescribed BP medications; reports he took Tylenol sinus and ?'s if that had something to do with his BP being elevated today. Pt reports a minor HA (3/10).

## 2016-07-27 ENCOUNTER — Emergency Department
Admission: EM | Admit: 2016-07-27 | Discharge: 2016-07-27 | Disposition: A | Discharge status: Home / Self Care | Payer: 59 | Attending: Emergency Medicine | Admitting: Emergency Medicine

## 2016-07-27 DIAGNOSIS — I10 Essential (primary) hypertension: Secondary | ICD-10-CM

## 2016-07-27 DIAGNOSIS — J0101 Acute recurrent maxillary sinusitis: Secondary | ICD-10-CM

## 2016-07-27 MED ORDER — FAMOTIDINE 20 MG PO TABS: 20.0000 mg | ORAL_TABLET | Freq: Once | ORAL | Status: DC

## 2016-07-27 MED ORDER — GI COCKTAIL ~~LOC~~: 30.0000 mL | Freq: Once | ORAL | Status: DC

## 2016-07-27 MED ORDER — AZITHROMYCIN 250 MG PO TABS: ORAL_TABLET | ORAL | 0 refills | Status: AC
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
## 2016-12-06 ENCOUNTER — Encounter: Payer: Self-pay | Admitting: Medical Oncology

## 2016-12-06 ENCOUNTER — Emergency Department
Admission: EM | Admit: 2016-12-06 | Discharge: 2016-12-06 | Disposition: A | Discharge status: Home / Self Care | Payer: 59 | Attending: Emergency Medicine | Admitting: Emergency Medicine

## 2016-12-06 DIAGNOSIS — I10 Essential (primary) hypertension: Secondary | ICD-10-CM | POA: Diagnosis not present

## 2016-12-06 DIAGNOSIS — Z79899 Other long term (current) drug therapy: Secondary | ICD-10-CM | POA: Diagnosis not present

## 2016-12-06 DIAGNOSIS — F17210 Nicotine dependence, cigarettes, uncomplicated: Secondary | ICD-10-CM | POA: Diagnosis not present

## 2016-12-06 DIAGNOSIS — L0231 Cutaneous abscess of buttock: Secondary | ICD-10-CM | POA: Diagnosis not present

## 2016-12-06 DIAGNOSIS — R222 Localized swelling, mass and lump, trunk: Secondary | ICD-10-CM | POA: Diagnosis present

## 2016-12-06 MED ORDER — CLINDAMYCIN HCL 300 MG PO CAPS: 300.0000 mg | ORAL_CAPSULE | Freq: Four times a day (QID) | ORAL | 0 refills | Status: AC

## 2016-12-06 MED ORDER — OXYCODONE-ACETAMINOPHEN 5-325 MG PO TABS
1.0000 | ORAL_TABLET | Freq: Once | ORAL | Status: AC
  Administered 2016-12-06: 1 via ORAL
  Filled 2016-12-06: qty 1

## 2016-12-06 MED ORDER — LIDOCAINE HCL (PF) 1 % IJ SOLN
5.0000 mL | Freq: Once | INTRAMUSCULAR | Status: DC
  Filled 2016-12-06: qty 5
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
## 2016-12-06 NOTE — ED Triage Notes (Signed)
Pt reports abscess to buttock x 3 days.

## 2016-12-06 NOTE — Discharge Instructions (Signed)
Keep wound area clean dry and covered with a dressing that is NOT sealed off and on for all 4 edges.  Continue to monitor the wound area for signs of healing. If you notice signs of worsening infection despite taking your antibiotic do not hesitate to return the emergency department.

## 2017-04-26 DIAGNOSIS — E781 Pure hyperglyceridemia: Secondary | ICD-10-CM | POA: Insufficient documentation

## 2017-10-02 ENCOUNTER — Other Ambulatory Visit: Payer: Self-pay

## 2017-10-02 ENCOUNTER — Encounter: Payer: Self-pay | Admitting: Emergency Medicine

## 2017-10-02 ENCOUNTER — Ambulatory Visit
Admission: EM | Admit: 2017-10-02 | Discharge: 2017-10-02 | Disposition: A | Discharge status: Home / Self Care | Payer: 59 | Attending: Family Medicine | Admitting: Family Medicine

## 2017-10-02 DIAGNOSIS — S93401A Sprain of unspecified ligament of right ankle, initial encounter: Secondary | ICD-10-CM | POA: Diagnosis not present

## 2017-10-02 MED ORDER — HYDROCODONE-ACETAMINOPHEN 5-325 MG PO TABS: ORAL_TABLET | ORAL | 0 refills | Status: DC

## 2017-10-02 MED ORDER — INDOMETHACIN ER 75 MG PO CPCR: 75.0000 mg | ORAL_CAPSULE | Freq: Two times a day (BID) | ORAL | 0 refills | Status: DC
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
## 2017-10-02 NOTE — ED Triage Notes (Signed)
Patient c/o right ankle pain that started 2 weeks ago. Patient denies injury or fall.

## 2018-01-16 ENCOUNTER — Other Ambulatory Visit: Payer: Self-pay | Admitting: Family Medicine

## 2018-01-16 DIAGNOSIS — R599 Enlarged lymph nodes, unspecified: Secondary | ICD-10-CM

## 2018-01-16 DIAGNOSIS — R591 Generalized enlarged lymph nodes: Secondary | ICD-10-CM

## 2018-11-11 ENCOUNTER — Emergency Department: Payer: 59

## 2018-11-11 ENCOUNTER — Encounter: Payer: Self-pay | Admitting: Emergency Medicine

## 2018-11-11 ENCOUNTER — Emergency Department
Admission: EM | Admit: 2018-11-11 | Discharge: 2018-11-11 | Disposition: A | Discharge status: Home / Self Care | Payer: 59 | Attending: Emergency Medicine | Admitting: Emergency Medicine

## 2018-11-11 ENCOUNTER — Other Ambulatory Visit: Payer: Self-pay

## 2018-11-11 DIAGNOSIS — I10 Essential (primary) hypertension: Secondary | ICD-10-CM | POA: Insufficient documentation

## 2018-11-11 DIAGNOSIS — F17210 Nicotine dependence, cigarettes, uncomplicated: Secondary | ICD-10-CM | POA: Insufficient documentation

## 2018-11-11 DIAGNOSIS — Y999 Unspecified external cause status: Secondary | ICD-10-CM | POA: Insufficient documentation

## 2018-11-11 DIAGNOSIS — Y9289 Other specified places as the place of occurrence of the external cause: Secondary | ICD-10-CM | POA: Insufficient documentation

## 2018-11-11 DIAGNOSIS — S61432A Puncture wound without foreign body of left hand, initial encounter: Secondary | ICD-10-CM | POA: Insufficient documentation

## 2018-11-11 DIAGNOSIS — Z23 Encounter for immunization: Secondary | ICD-10-CM | POA: Insufficient documentation

## 2018-11-11 DIAGNOSIS — S6991XA Unspecified injury of right wrist, hand and finger(s), initial encounter: Secondary | ICD-10-CM | POA: Diagnosis present

## 2018-11-11 DIAGNOSIS — S61212A Laceration without foreign body of right middle finger without damage to nail, initial encounter: Secondary | ICD-10-CM | POA: Diagnosis not present

## 2018-11-11 DIAGNOSIS — Y9389 Activity, other specified: Secondary | ICD-10-CM | POA: Diagnosis not present

## 2018-11-11 DIAGNOSIS — S51811A Laceration without foreign body of right forearm, initial encounter: Secondary | ICD-10-CM | POA: Insufficient documentation

## 2018-11-11 DIAGNOSIS — W540XXA Bitten by dog, initial encounter: Secondary | ICD-10-CM | POA: Insufficient documentation

## 2018-11-11 DIAGNOSIS — Z79899 Other long term (current) drug therapy: Secondary | ICD-10-CM | POA: Insufficient documentation

## 2018-11-11 DIAGNOSIS — S51831A Puncture wound without foreign body of right forearm, initial encounter: Secondary | ICD-10-CM | POA: Insufficient documentation

## 2018-11-11 MED ORDER — TETANUS-DIPHTH-ACELL PERTUSSIS 5-2.5-18.5 LF-MCG/0.5 IM SUSP
0.5000 mL | Freq: Once | INTRAMUSCULAR | Status: AC
  Administered 2018-11-11: 20:00:00 0.5 mL via INTRAMUSCULAR
  Filled 2018-11-11: qty 0.5

## 2018-11-11 MED ORDER — LIDOCAINE HCL 1 % IJ SOLN
10.0000 mL | Freq: Once | INTRAMUSCULAR | Status: AC
  Administered 2018-11-11: 19:00:00 10 mL
  Filled 2018-11-11: qty 10

## 2018-11-11 MED ORDER — OXYCODONE-ACETAMINOPHEN 5-325 MG PO TABS
1.0000 | ORAL_TABLET | Freq: Once | ORAL | Status: AC
  Administered 2018-11-11: 20:00:00 1 via ORAL
  Filled 2018-11-11: qty 1

## 2018-11-11 MED ORDER — CEPHALEXIN 500 MG PO CAPS: 500.0000 mg | ORAL_CAPSULE | Freq: Three times a day (TID) | ORAL | 0 refills | Status: AC

## 2018-11-11 MED ORDER — SULFAMETHOXAZOLE-TRIMETHOPRIM 800-160 MG PO TABS: 1.0000 | ORAL_TABLET | Freq: Two times a day (BID) | ORAL | 0 refills | Status: AC
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
## 2018-11-11 NOTE — Discharge Instructions (Signed)
Take Bactrim twice daily for the next 7 days. Take Keflex 3 times daily for the next 7 days. Have sutures removed in 7 days. Keep wounds clean and dry for the next 24 hours. Return to the emergency department with redness or streaking surrounding bite wounds. Return to the emergency department with 10 out of 10 pain of the right forearm, hardness of the right forearm or coldness of the right forearm.
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
## 2021-03-16 ENCOUNTER — Other Ambulatory Visit: Payer: Self-pay | Admitting: Family Medicine

## 2021-03-16 ENCOUNTER — Ambulatory Visit
Admission: RE | Admit: 2021-03-16 | Discharge: 2021-03-16 | Disposition: A | Discharge status: Home / Self Care | Payer: BC Managed Care – PPO | Source: Ambulatory Visit | Attending: Family Medicine | Admitting: Family Medicine

## 2021-03-16 ENCOUNTER — Ambulatory Visit
Admission: RE | Admit: 2021-03-16 | Discharge: 2021-03-16 | Disposition: A | Discharge status: Home / Self Care | Payer: BC Managed Care – PPO | Attending: Family Medicine | Admitting: Family Medicine

## 2021-03-16 DIAGNOSIS — M25561 Pain in right knee: Secondary | ICD-10-CM | POA: Insufficient documentation

## 2021-03-16 DIAGNOSIS — G8929 Other chronic pain: Secondary | ICD-10-CM | POA: Insufficient documentation

## 2021-10-22 ENCOUNTER — Encounter: Admission: RE | Payer: Self-pay | Source: Home / Self Care

## 2021-10-22 ENCOUNTER — Ambulatory Visit: Admission: RE | Admit: 2021-10-22 | Payer: BC Managed Care – PPO | Source: Home / Self Care

## 2021-10-22 SURGERY — COLONOSCOPY WITH PROPOFOL
Anesthesia: General

## 2022-05-09 DIAGNOSIS — E119 Type 2 diabetes mellitus without complications: Secondary | ICD-10-CM | POA: Insufficient documentation

## 2023-02-10 ENCOUNTER — Ambulatory Visit
Admission: EM | Admit: 2023-02-10 | Discharge: 2023-02-10 | Disposition: A | Discharge status: Home / Self Care | Payer: BC Managed Care – PPO | Attending: Emergency Medicine | Admitting: Emergency Medicine

## 2023-02-10 ENCOUNTER — Ambulatory Visit: Payer: BC Managed Care – PPO

## 2023-02-10 DIAGNOSIS — S8392XA Sprain of unspecified site of left knee, initial encounter: Secondary | ICD-10-CM

## 2023-02-10 MED ORDER — TRAMADOL HCL 50 MG PO TABS: 50.0000 mg | ORAL_TABLET | Freq: Four times a day (QID) | ORAL | 0 refills | Status: AC | PRN

## 2023-02-10 MED ORDER — DEXAMETHASONE SODIUM PHOSPHATE 10 MG/ML IJ SOLN
10.0000 mg | Freq: Once | INTRAMUSCULAR | Status: AC
  Administered 2023-02-10: 10 mg via INTRAMUSCULAR

## 2023-02-10 MED ORDER — PREDNISONE 10 MG (21) PO TBPK: ORAL_TABLET | ORAL | 0 refills | Status: DC
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
## 2023-02-10 NOTE — ED Triage Notes (Signed)
Pt c/o left knee swelling x2days  Pt declines any falls.  Pt states that he stepped the wrong way and "heard something go pop". Pt states that it began to swell, get hot, and became painful.  Pt states that he can barely walk  Pt is wearing a knee brace. Marland Kitchen

## 2023-02-10 NOTE — Discharge Instructions (Addendum)
Your x-rays show that she has got some degenerative changes in your knee but no evidence of broken bones.  I do believe that you have sprained your knee, which is a soft tissue injury.  Continue to wear your knee brace for support when you are up and moving.  Keep your left knee elevated is much as possible to help decrease pain and swelling.  You may apply ice to your knee for 20-minute at a time, 2-3 times a day, to help with pain and swelling.  Starting tomorrow morning take the prednisone according to the package instructions.  You will take this once daily for 6 days for treatment of pain and inflammation.  I have also given you prescription for tramadol that she can use for more severe pain or at bedtime.  Be mindful this medication will make you drowsy so do not drink alcohol or drive if you take it.  If your pain and swelling do not improve I recommend that you follow-up with orthopedics.

## 2023-11-03 ENCOUNTER — Other Ambulatory Visit: Payer: Self-pay

## 2023-11-03 ENCOUNTER — Ambulatory Visit
Admission: RE | Admit: 2023-11-03 | Discharge: 2023-11-03 | Disposition: A | Discharge status: Home / Self Care | Attending: Gastroenterology | Admitting: Gastroenterology

## 2023-11-03 ENCOUNTER — Ambulatory Visit: Admitting: Registered Nurse

## 2023-11-03 ENCOUNTER — Encounter: Payer: Self-pay | Admitting: Gastroenterology

## 2023-11-03 ENCOUNTER — Encounter
Admission: RE | Disposition: A | Discharge status: Home / Self Care | Payer: Self-pay | Source: Home / Self Care | Attending: Gastroenterology

## 2023-11-03 DIAGNOSIS — Z1211 Encounter for screening for malignant neoplasm of colon: Secondary | ICD-10-CM | POA: Diagnosis present

## 2023-11-03 DIAGNOSIS — Z538 Procedure and treatment not carried out for other reasons: Secondary | ICD-10-CM | POA: Diagnosis not present

## 2023-11-03 DIAGNOSIS — F17200 Nicotine dependence, unspecified, uncomplicated: Secondary | ICD-10-CM | POA: Diagnosis not present

## 2023-11-03 HISTORY — PX: COLONOSCOPY: SHX5424

## 2023-11-03 SURGERY — COLONOSCOPY
Anesthesia: General

## 2023-11-03 MED ORDER — PROPOFOL 10 MG/ML IV BOLUS
INTRAVENOUS | Status: AC
  Filled 2023-11-03: qty 20

## 2023-11-03 MED ORDER — SODIUM CHLORIDE 0.9 % IV SOLN: INTRAVENOUS | Status: DC

## 2023-11-03 MED ORDER — LIDOCAINE HCL (PF) 2 % IJ SOLN
INTRAMUSCULAR | Status: AC
  Filled 2023-11-03: qty 5

## 2023-11-03 NOTE — OR Nursing (Signed)
 Pt ate solid food all day prior to colonoscopy, stool still liquid dark brown, drinking water in lobby prior to coming to Endo. Pt will be r/s with Dr. Unk.
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
## 2023-11-17 ENCOUNTER — Ambulatory Visit: Admitting: Anesthesiology

## 2023-11-17 ENCOUNTER — Encounter
Admission: RE | Disposition: A | Discharge status: Home / Self Care | Payer: Self-pay | Source: Home / Self Care | Attending: Gastroenterology

## 2023-11-17 ENCOUNTER — Ambulatory Visit
Admission: RE | Admit: 2023-11-17 | Discharge: 2023-11-17 | Disposition: A | Discharge status: Home / Self Care | Attending: Gastroenterology | Admitting: Gastroenterology

## 2023-11-17 ENCOUNTER — Other Ambulatory Visit: Payer: Self-pay

## 2023-11-17 ENCOUNTER — Encounter: Payer: Self-pay | Admitting: Gastroenterology

## 2023-11-17 DIAGNOSIS — K219 Gastro-esophageal reflux disease without esophagitis: Secondary | ICD-10-CM | POA: Insufficient documentation

## 2023-11-17 DIAGNOSIS — Z1211 Encounter for screening for malignant neoplasm of colon: Secondary | ICD-10-CM | POA: Insufficient documentation

## 2023-11-17 DIAGNOSIS — F17210 Nicotine dependence, cigarettes, uncomplicated: Secondary | ICD-10-CM | POA: Diagnosis not present

## 2023-11-17 DIAGNOSIS — J449 Chronic obstructive pulmonary disease, unspecified: Secondary | ICD-10-CM | POA: Diagnosis not present

## 2023-11-17 DIAGNOSIS — I10 Essential (primary) hypertension: Secondary | ICD-10-CM | POA: Insufficient documentation

## 2023-11-17 DIAGNOSIS — F17290 Nicotine dependence, other tobacco product, uncomplicated: Secondary | ICD-10-CM | POA: Insufficient documentation

## 2023-11-17 DIAGNOSIS — Z860100 Personal history of colon polyps, unspecified: Secondary | ICD-10-CM | POA: Diagnosis not present

## 2023-11-17 HISTORY — PX: COLONOSCOPY: SHX5424

## 2023-11-17 SURGERY — COLONOSCOPY
Anesthesia: General

## 2023-11-17 MED ORDER — PROPOFOL 1000 MG/100ML IV EMUL
INTRAVENOUS | Status: AC
  Filled 2023-11-17: qty 100

## 2023-11-17 MED ORDER — PROPOFOL 500 MG/50ML IV EMUL
INTRAVENOUS | Status: DC | PRN
  Administered 2023-11-17: 150 ug/kg/min via INTRAVENOUS

## 2023-11-17 MED ORDER — SODIUM CHLORIDE 0.9 % IV SOLN: INTRAVENOUS | Status: DC

## 2023-11-17 MED ORDER — DEXMEDETOMIDINE HCL IN NACL 80 MCG/20ML IV SOLN
INTRAVENOUS | Status: DC | PRN
  Administered 2023-11-17: 8 ug via INTRAVENOUS

## 2023-11-17 MED ORDER — PROPOFOL 10 MG/ML IV BOLUS
INTRAVENOUS | Status: DC | PRN
  Administered 2023-11-17: 100 mg via INTRAVENOUS

## 2023-11-17 MED ORDER — LIDOCAINE HCL (CARDIAC) PF 100 MG/5ML IV SOSY
PREFILLED_SYRINGE | INTRAVENOUS | Status: DC | PRN
  Administered 2023-11-17: 40 mg via INTRAVENOUS

## 2023-11-17 NOTE — Transfer of Care (Signed)
 Immediate Anesthesia Transfer of Care Note  Patient: Jim Juarez  Procedure(s) Performed: Procedure(s): COLONOSCOPY (N/A)  Patient Location: PACU and Endoscopy Unit  Anesthesia Type:General  Level of Consciousness: sedated  Airway & Oxygen Therapy: Patient Spontanous Breathing and Patient connected to nasal cannula oxygen  Post-op Assessment: Report given to RN and Post -op Vital signs reviewed and stable  Post vital signs: Reviewed and stable  Last Vitals:  Vitals:   11/17/23 0712 11/17/23 0809  BP: (!) 133/90 105/75  Pulse: 66 76  Resp: 15 17  Temp: (!) 35.8 C   SpO2: 100% 97%    Complications: No apparent anesthesia complications
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
## 2023-11-17 NOTE — Op Note (Signed)
 [facility] Gastroenterology Patient Name: Jim Juarez Procedure Date: 11/17/2023 7:13 AM MRN: [id number] Account #: [id number] Date of Birth: [birth year] Admit Type: Outpatient Age: 63 Room: [facility] ENDO ROOM 4 Gender: Male Note Status: Finalized Instrument Name: Colon Scope [id number] Procedure:             Colonoscopy Indications:           High risk colon cancer surveillance: Personal history                         of colonic polyps, Surveillance: Personal history of                         colonic polyps (unknown histology) on last colonoscopy                         more than 5 years ago Providers:             Corinn Jess Brooklyn MD, MD Referring MD:          Perri Halim Medicines:             General Anesthesia Complications:         No immediate complications. Estimated blood loss: None. Procedure:             Pre-Anesthesia Assessment:                        - Prior to the procedure, a History and Physical was                         performed, and patient medications and allergies were                         reviewed. The patient is competent. The risks and                         benefits of the procedure and the sedation options and                         risks were discussed with the patient. All questions                         were answered and informed consent was obtained.                         Patient identification and proposed procedure were                         verified by the physician, the nurse, the                         anesthesiologist, the anesthetist and the technician                         in the pre-procedure area in the procedure room in the                         endoscopy suite. Mental Status Examination: alert and  oriented. Airway Examination: normal oropharyngeal                         airway and neck mobility. Respiratory Examination:                         clear to auscultation. CV  Examination: normal.                         Prophylactic Antibiotics: The patient does not require                         prophylactic antibiotics. Prior Anticoagulants: The                         patient has taken no anticoagulant or antiplatelet                         agents. ASA Grade Assessment: II - A patient with mild                         systemic disease. After reviewing the risks and                         benefits, the patient was deemed in satisfactory                         condition to undergo the procedure. The anesthesia                         plan was to use general anesthesia. Immediately prior                         to administration of medications, the patient was                         re-assessed for adequacy to receive sedatives. The                         heart rate, respiratory rate, oxygen saturations,                         blood pressure, adequacy of pulmonary ventilation, and                         response to care were monitored throughout the                         procedure. The physical status of the patient was                         re-assessed after the procedure.                        After obtaining informed consent, the colonoscope was                         passed under direct vision. Throughout the procedure,  the patient's blood pressure, pulse, and oxygen                         saturations were monitored continuously. The                         Colonoscope was introduced through the anus and                         advanced to the the cecum, identified by appendiceal                         orifice and ileocecal valve. The colonoscopy was                         performed without difficulty. The patient tolerated                         the procedure well. The ileocecal valve, appendiceal                         orifice, and rectum were photographed. The quality of                         the bowel  preparation was evaluated using the BBPS                         [facility] Bowel Preparation Scale) with scores of: Right                         Colon = 2 (minor amount of residual staining, small                         fragments of stool and/or opaque liquid, but mucosa                         seen well), Transverse Colon = 3 (entire mucosa seen                         well with no residual staining, small fragments of                         stool or opaque liquid) and Left Colon = 3 (entire                         mucosa seen well with no residual staining, small                         fragments of stool or opaque liquid). The total BBPS                         score equals 8. Findings:      The perianal and digital rectal examinations were normal. Pertinent       negatives include normal sphincter tone and no palpable rectal lesions.      The colon (entire examined portion) appeared normal.      The retroflexed view of the distal rectum and  anal verge was normal and       showed no anal or rectal abnormalities. Impression:            - The entire examined colon is normal.                        - The distal rectum and anal verge are normal on                         retroflexion view.                        - No specimens collected. Recommendation:        - Discharge patient to home (with escort).                        - Resume previous diet today.                        - Continue present medications.                        - Repeat colonoscopy in 10 years for screening                         purposes. Procedure Code(s):     --- Professional ---                        H9894, Colorectal cancer screening; colonoscopy on                         individual at high risk Diagnosis Code(s):     --- Professional ---                        Z86.010, Personal history of colonic polyps CPT copyright 2022 American Medical Association. All rights reserved. The codes documented in this report  are preliminary and upon coder review may  be revised to meet current compliance requirements. Dr. Corinn Brooklyn Corinn Jess Brooklyn MD, MD 11/17/2023 8:10:16 AM This report has been signed electronically. Number of Addenda: 0 Note Initiated On: 11/17/2023 7:13 AM Scope Withdrawal Time: 0 hours 7 minutes 11 seconds  Total Procedure Duration: 0 hours 8 minutes 52 seconds  Estimated Blood Loss:  Estimated blood loss: none.      [facility]

## 2023-11-17 NOTE — Anesthesia Postprocedure Evaluation (Signed)
 Anesthesia Post Note  Patient: Jim Juarez  Procedure(s) Performed: COLONOSCOPY  Patient location during evaluation: Endoscopy Anesthesia Type: General Level of consciousness: awake and alert Pain management: pain level controlled Vital Signs Assessment: post-procedure vital signs reviewed and stable Respiratory status: spontaneous breathing, nonlabored ventilation and respiratory function stable Cardiovascular status: blood pressure returned to baseline and stable Postop Assessment: no apparent nausea or vomiting Anesthetic complications: no   No notable events documented.   Last Vitals:  Vitals:   11/17/23 0829 11/17/23 0839  BP: 113/86 (!) 125/93  Pulse: 63 63  Resp: 15 18  Temp:    SpO2: 100% 100%    Last Pain:  Vitals:   11/17/23 0829  TempSrc:   PainSc: 0-No pain                 Fairy POUR Charlean Carneal
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
## 2024-03-20 ENCOUNTER — Ambulatory Visit
Admission: EM | Admit: 2024-03-20 | Discharge: 2024-03-20 | Disposition: A | Discharge status: Home / Self Care | Source: Home / Self Care | Attending: Family Medicine | Admitting: Family Medicine

## 2024-03-20 DIAGNOSIS — M25511 Pain in right shoulder: Secondary | ICD-10-CM | POA: Diagnosis present

## 2024-03-20 DIAGNOSIS — M25512 Pain in left shoulder: Secondary | ICD-10-CM | POA: Insufficient documentation

## 2024-03-20 DIAGNOSIS — R0981 Nasal congestion: Secondary | ICD-10-CM | POA: Insufficient documentation

## 2024-03-20 DIAGNOSIS — R21 Rash and other nonspecific skin eruption: Secondary | ICD-10-CM | POA: Diagnosis not present

## 2024-03-20 LAB — CBC WITH DIFFERENTIAL/PLATELET
Abs Immature Granulocytes: 0.02 K/uL (ref 0.00–0.07)
Basophils Absolute: 0 K/uL (ref 0.0–0.1)
Basophils Relative: 1 %
Eosinophils Absolute: 0.2 K/uL (ref 0.0–0.5)
Eosinophils Relative: 3 %
HCT: 40.5 % (ref 39.0–52.0)
Hemoglobin: 13.5 g/dL (ref 13.0–17.0)
Immature Granulocytes: 0 %
Lymphocytes Relative: 20 %
Lymphs Abs: 1.5 K/uL (ref 0.7–4.0)
MCH: 30.3 pg (ref 26.0–34.0)
MCHC: 33.3 g/dL (ref 30.0–36.0)
MCV: 90.8 fL (ref 80.0–100.0)
Monocytes Absolute: 0.6 K/uL (ref 0.1–1.0)
Monocytes Relative: 9 %
Neutro Abs: 5.1 K/uL (ref 1.7–7.7)
Neutrophils Relative %: 67 %
Platelets: 310 K/uL (ref 150–400)
RBC: 4.46 MIL/uL (ref 4.22–5.81)
RDW: 14.4 % (ref 11.5–15.5)
WBC: 7.5 K/uL (ref 4.0–10.5)
nRBC: 0 % (ref 0.0–0.2)

## 2024-03-20 LAB — COMPREHENSIVE METABOLIC PANEL WITH GFR
ALT: 28 U/L (ref 0–44)
AST: 29 U/L (ref 15–41)
Albumin: 4.2 g/dL (ref 3.5–5.0)
Alkaline Phosphatase: 139 U/L — ABNORMAL HIGH (ref 38–126)
Anion gap: 10 (ref 5–15)
BUN: 10 mg/dL (ref 8–23)
CO2: 30 mmol/L (ref 22–32)
Calcium: 9.4 mg/dL (ref 8.9–10.3)
Chloride: 98 mmol/L (ref 98–111)
Creatinine, Ser: 1.07 mg/dL (ref 0.61–1.24)
GFR, Estimated: >60 mL/min
Glucose, Bld: 92 mg/dL (ref 70–99)
Potassium: 4.1 mmol/L (ref 3.5–5.1)
Sodium: 138 mmol/L (ref 135–145)
Total Bilirubin: 1.2 mg/dL (ref 0.0–1.2)
Total Protein: 7.8 g/dL (ref 6.5–8.1)

## 2024-03-20 LAB — C-REACTIVE PROTEIN: CRP: 4 mg/dL — ABNORMAL HIGH

## 2024-03-20 LAB — SEDIMENTATION RATE: Sed Rate: 31 mm/h — ABNORMAL HIGH (ref 0–20)

## 2024-03-20 MED ORDER — AZITHROMYCIN 250 MG PO TABS: ORAL_TABLET | ORAL | 0 refills | Status: DC

## 2024-03-20 MED ORDER — PREDNISONE 10 MG (21) PO TBPK: ORAL_TABLET | Freq: Every day | ORAL | 0 refills | Status: DC
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
## 2024-03-20 NOTE — Discharge Instructions (Signed)
 I ordered some tests to begin the evaluation for your rash.  If significantly abnormal, someone will call you.  You can see your results in MyChart if you choose to download it.  Feel free to call the urgent care for results, if you do not hear anything, on Friday.   Start the antibiotics and steroids as directed.

## 2024-03-20 NOTE — ED Triage Notes (Signed)
 Patient to Urgent Care with complaints of sinus pain/ nasal congestion (discolored). Symptoms started Wednesday. Using alka-seltzer sinus and cold.  Reports he ate shrimp last night . Woke up this morning with a rash to bilateral, lower legs, bilateral shoulder pain. No otc meds attempted.

## 2024-03-21 ENCOUNTER — Ambulatory Visit (HOSPITAL_COMMUNITY): Payer: Self-pay

## 2024-03-21 LAB — ANTINUCLEAR ANTIBODIES, IFA: ANA Ab, IFA: NEGATIVE

## 2024-03-21 LAB — SYPHILIS: RPR W/REFLEX TO RPR TITER AND TREPONEMAL ANTIBODIES, TRADITIONAL SCREENING AND DIAGNOSIS ALGORITHM: RPR Ser Ql: NONREACTIVE

## 2024-03-27 ENCOUNTER — Ambulatory Visit
Admission: EM | Admit: 2024-03-27 | Discharge: 2024-03-27 | Disposition: A | Discharge status: Home / Self Care | Attending: Family Medicine | Admitting: Family Medicine

## 2024-03-27 DIAGNOSIS — R52 Pain, unspecified: Secondary | ICD-10-CM | POA: Diagnosis not present

## 2024-03-27 DIAGNOSIS — E1169 Type 2 diabetes mellitus with other specified complication: Secondary | ICD-10-CM | POA: Diagnosis not present

## 2024-03-27 DIAGNOSIS — Z7984 Long term (current) use of oral hypoglycemic drugs: Secondary | ICD-10-CM

## 2024-03-27 LAB — POCT URINE DIPSTICK
Bilirubin, UA: NEGATIVE
Blood, UA: NEGATIVE
Glucose, UA: NEGATIVE mg/dL
Ketones, POC UA: NEGATIVE mg/dL
Leukocytes, UA: NEGATIVE
Nitrite, UA: NEGATIVE
Protein Ur, POC: NEGATIVE mg/dL
Spec Grav, UA: 1.015
Urobilinogen, UA: 0.2 U/dL
pH, UA: 5.5

## 2024-03-27 LAB — GLUCOSE, POCT (MANUAL RESULT ENTRY): POCT Glucose (KUC): 128 mg/dL — AB (ref 70–99)
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
## 2024-03-27 NOTE — ED Triage Notes (Signed)
 Pt c/o bodyaches that started today. States still having soreness in arms & legs. Has tried OTC meds w/o relief. Last dose of bayer back & body around 1100. Concerned for dehydration.

## 2024-03-27 NOTE — Discharge Instructions (Addendum)
 Your blood sugar is normal.  You are well-hydrated.  We discussed repeating your inflammatory markers today but decided to wait for now.  Return to the urgent care or see your primary care provider, if your symptoms are not improving.  Go to ED for red flag symptoms, including; fevers you cannot reduce with Tylenol /Motrin , severe headaches, vision changes, numbness/weakness in part of the body, lethargy, confusion, intractable vomiting, severe dehydration, chest pain, breathing difficulty, severe persistent abdominal or pelvic pain, signs of severe infection (increased redness, swelling of an area), feeling faint or passing out, dizziness, etc. You should especially go to the ED for sudden acute worsening of condition if you do not elect to go at this time.
